# Patient Record
Sex: Female | Born: 1955 | Race: White | Hispanic: No | Smoking: Never smoker
Health system: Southern US, Community
[De-identification: ages and names within clinical notes are randomized; demographics above are authoritative.]

## PROBLEM LIST (undated history)

## (undated) DIAGNOSIS — M810 Age-related osteoporosis without current pathological fracture: Secondary | ICD-10-CM

---

## 2019-12-28 ENCOUNTER — Emergency Department (HOSPITAL_BASED_OUTPATIENT_CLINIC_OR_DEPARTMENT_OTHER)
Admission: EM | Admit: 2019-12-28 | Discharge: 2019-12-28 | Disposition: A | Discharge status: Home / Self Care | Payer: Medicare Other | Attending: Emergency Medicine | Admitting: Emergency Medicine

## 2019-12-28 ENCOUNTER — Emergency Department (HOSPITAL_BASED_OUTPATIENT_CLINIC_OR_DEPARTMENT_OTHER): Payer: Medicare Other

## 2019-12-28 ENCOUNTER — Encounter (HOSPITAL_BASED_OUTPATIENT_CLINIC_OR_DEPARTMENT_OTHER): Payer: Self-pay

## 2019-12-28 ENCOUNTER — Other Ambulatory Visit: Payer: Self-pay

## 2019-12-28 DIAGNOSIS — R519 Headache, unspecified: Secondary | ICD-10-CM | POA: Diagnosis not present

## 2019-12-28 DIAGNOSIS — R05 Cough: Secondary | ICD-10-CM | POA: Diagnosis not present

## 2019-12-28 DIAGNOSIS — R0602 Shortness of breath: Secondary | ICD-10-CM | POA: Diagnosis not present

## 2019-12-28 DIAGNOSIS — R5383 Other fatigue: Secondary | ICD-10-CM | POA: Diagnosis present

## 2019-12-28 DIAGNOSIS — R438 Other disturbances of smell and taste: Secondary | ICD-10-CM | POA: Insufficient documentation

## 2019-12-28 DIAGNOSIS — U071 COVID-19: Secondary | ICD-10-CM | POA: Insufficient documentation

## 2019-12-28 DIAGNOSIS — R0789 Other chest pain: Secondary | ICD-10-CM | POA: Diagnosis not present

## 2019-12-28 DIAGNOSIS — R531 Weakness: Secondary | ICD-10-CM | POA: Insufficient documentation

## 2019-12-28 DIAGNOSIS — M7918 Myalgia, other site: Secondary | ICD-10-CM | POA: Insufficient documentation

## 2019-12-28 HISTORY — DX: Age-related osteoporosis without current pathological fracture: M81.0

## 2019-12-28 LAB — COMPREHENSIVE METABOLIC PANEL
ALT: 122 U/L — ABNORMAL HIGH (ref 0–44)
AST: 113 U/L — ABNORMAL HIGH (ref 15–41)
Albumin: 3.2 g/dL — ABNORMAL LOW (ref 3.5–5.0)
Alkaline Phosphatase: 76 U/L (ref 38–126)
Anion gap: 9 (ref 5–15)
BUN: 13 mg/dL (ref 8–23)
CO2: 20 mmol/L — ABNORMAL LOW (ref 22–32)
Calcium: 8.4 mg/dL — ABNORMAL LOW (ref 8.9–10.3)
Chloride: 105 mmol/L (ref 98–111)
Creatinine, Ser: 0.65 mg/dL (ref 0.44–1.00)
GFR calc Af Amer: >60 mL/min (ref >60)
GFR calc non Af Amer: >60 mL/min (ref >60)
Glucose, Bld: 101 mg/dL — ABNORMAL HIGH (ref 70–99)
Potassium: 3.9 mmol/L (ref 3.5–5.1)
Sodium: 134 mmol/L — ABNORMAL LOW (ref 135–145)
Total Bilirubin: 0.6 mg/dL (ref 0.3–1.2)
Total Protein: 7 g/dL (ref 6.5–8.1)

## 2019-12-28 LAB — CBC WITH DIFFERENTIAL/PLATELET
Abs Immature Granulocytes: 0.01 10*3/uL (ref 0.00–0.07)
Basophils Absolute: 0 10*3/uL (ref 0.0–0.1)
Basophils Relative: 0 %
Eosinophils Absolute: 0 10*3/uL (ref 0.0–0.5)
Eosinophils Relative: 0 %
HCT: 41.5 % (ref 36.0–46.0)
Hemoglobin: 14.2 g/dL (ref 12.0–15.0)
Immature Granulocytes: 0 %
Lymphocytes Relative: 59 %
Lymphs Abs: 3.2 10*3/uL (ref 0.7–4.0)
MCH: 30 pg (ref 26.0–34.0)
MCHC: 34.2 g/dL (ref 30.0–36.0)
MCV: 87.7 fL (ref 80.0–100.0)
Monocytes Absolute: 0.4 10*3/uL (ref 0.1–1.0)
Monocytes Relative: 7 %
Neutro Abs: 1.8 10*3/uL (ref 1.7–7.7)
Neutrophils Relative %: 34 %
Platelets: 274 10*3/uL (ref 150–400)
RBC: 4.73 MIL/uL (ref 3.87–5.11)
RDW: 12.6 % (ref 11.5–15.5)
WBC: 5.4 10*3/uL (ref 4.0–10.5)
nRBC: 0 % (ref 0.0–0.2)

## 2019-12-28 MED ORDER — BENZONATATE 100 MG PO CAPS: 100.0000 mg | ORAL_CAPSULE | Freq: Three times a day (TID) | ORAL | 0 refills | Status: DC

## 2019-12-28 MED ORDER — BENZONATATE 100 MG PO CAPS: 100.0000 mg | ORAL_CAPSULE | Freq: Three times a day (TID) | ORAL | 0 refills | Status: AC

## 2019-12-28 NOTE — ED Triage Notes (Signed)
Pt was found to be COvid + 11 days ago states that she is not getting any better, c/o fatigue, loss of taste and smell, and cough.

## 2019-12-28 NOTE — ED Notes (Signed)
O2 sat 93-96% while ambulating

## 2019-12-28 NOTE — Discharge Instructions (Addendum)
Please read the attachment on ways to manage your COVID-19 symptoms.  Please continue take Tylenol as needed for any fever control.  I prescribed you Tessalon Perles to take as an anticough agent.  Please continue to hydrate and eat regular meals despite your lack of appetite and taste.  Return to the ED or seek immediate medical attention should you experience any new or worsening symptoms.

## 2019-12-28 NOTE — ED Provider Notes (Signed)
[facility] EMERGENCY DEPARTMENT Provider Note   CSN: [id number] Arrival date & time: 12/28/19  1534     History Chief Complaint  Patient presents with  . Fatigue    Linda Singleton is a 64 y.o. female with no PMH presents to the ED with complaints of ongoing fatigue, weakness, dry cough, body aches, diminished appetite, loss of taste and smell, and shortness of breath symptoms since diagnosis of COVID-19 on 12/17/2019.  Patient reports that she was symptomatic with a headache 1 day prior to being tested.  She states that she has been unable to care for self at home and has been relying on her friends and family to cook for, however they are now also sick with COVID-19.  She reports that her fevers are well controlled, but she continues to endorse weakness and fatigue which prompted her to come to the ED for evaluation.  She states that she is able to eat and drink and denies any nausea or vomiting, but states that she has been eating very little and had several episodes of loose stools earlier into her course of illness.  She also endorses mild chest discomfort, but purely associated with her cough and not with exertion.  She denies any dizziness, exertional chest pain, abdominal pain, nausea or vomiting, urinary symptoms, changes in bowel habits, numbness, unilateral weakness, blurred vision, or other neurologic symptoms.  HPI     Past Medical History:  Diagnosis Date  . Osteoporosis     There are no problems to display for this patient.   History reviewed. No pertinent surgical history.   OB History   No obstetric history on file.     No family history on file.  Social History   Tobacco Use  . Smoking status: Never Smoker  . Smokeless tobacco: Never Used  Substance Use Topics  . Alcohol use: Yes    Comment: social  . Drug use: Never    Home Medications Prior to Admission medications   Medication Sig Start Date End Date Taking? Authorizing Provider    benzonatate (TESSALON) 100 MG capsule Take 1 capsule (100 mg total) by mouth every 8 (eight) hours. 12/28/19   Corena Herter, PA-C    Allergies    Patient has no known allergies.  Review of Systems   Review of Systems  All other systems reviewed and are negative.   Physical Exam Updated Vital Signs BP (!) 120/106   Pulse 75   Temp 98.6 F (37 C) (Oral)   Resp 18   Ht 5\' 6"  (1.676 m)   Wt 65.8 kg   SpO2 96%   BMI 23.40 kg/m   Physical Exam Vitals and nursing note reviewed. Exam conducted with a chaperone present.  Constitutional:      Appearance: Normal appearance.  HENT:     Head: Normocephalic and atraumatic.     Mouth/Throat:     Pharynx: Oropharynx is clear.  Eyes:     General: No scleral icterus.    Conjunctiva/sclera: Conjunctivae normal.  Cardiovascular:     Rate and Rhythm: Normal rate and regular rhythm.     Pulses: Normal pulses.     Heart sounds: Normal heart sounds.  Pulmonary:     Comments: No significant increased work of breathing.  No tachypnea.  No accessory muscle use.  Breath sounds intact bilaterally.  No wheezing or significant rales appreciated.  Mild reproducible anterior chest wall tenderness to palpation. Abdominal:     General: Abdomen is flat.  There is no distension.     Palpations: Abdomen is soft.     Tenderness: There is no abdominal tenderness. There is no guarding.  Musculoskeletal:     Cervical back: Normal range of motion and neck supple. No rigidity.  Skin:    General: Skin is dry.     Capillary Refill: Capillary refill takes less than 2 seconds.  Neurological:     Mental Status: She is alert and oriented to person, place, and time.     GCS: GCS eye subscore is 4. GCS verbal subscore is 5. GCS motor subscore is 6.  Psychiatric:        Mood and Affect: Mood normal.        Behavior: Behavior normal.        Thought Content: Thought content normal.     ED Results / Procedures / Treatments   Labs (all labs ordered are  listed, but only abnormal results are displayed) Labs Reviewed  COMPREHENSIVE METABOLIC PANEL - Abnormal; Notable for the following components:      Result Value   Sodium 134 (*)    CO2 20 (*)    Glucose, Bld 101 (*)    Calcium 8.4 (*)    Albumin 3.2 (*)    AST 113 (*)    ALT 122 (*)    All other components within normal limits  CBC WITH DIFFERENTIAL/PLATELET    EKG None  Radiology DG Chest Portable 1 View  Result Date: 12/28/2019 CLINICAL DATA:  Fever, body aches and cough. EXAM: PORTABLE CHEST 1 VIEW COMPARISON:  None. FINDINGS: Mild linear scarring and/or atelectasis is seen within the retrocardiac region of the left lung base. There is no evidence of a pleural effusion or pneumothorax. The heart size and mediastinal contours are within normal limits. The visualized skeletal structures are unremarkable. IMPRESSION: Mild linear scarring and/or atelectasis within the left lung base. Electronically Signed   By: Aram Candela M.D.   On: 12/28/2019 16:23    Procedures Procedures (including critical care time)  Medications Ordered in ED Medications - No data to display  ED Course  I have reviewed the triage vital signs and the nursing notes.  Pertinent labs & imaging results that were available during my care of the patient were reviewed by me and considered in my medical decision making (see chart for details).    MDM Rules/Calculators/A&P                      I reviewed plain films obtained of chest which demonstrates mild linear scarring versus atelectasis in left lung base, however no pleural effusion, pneumothorax, or significant consolidation otherwise concerning for pneumonia.  Patient's CBC was entirely within normal limits, but CMP demonstrated transaminitis.  No recent labs with which to compare.  Repeat physical exam demonstrated no abdominal TTP or obvious hepatomegaly.  Will encourage patient to establish care with a primary care provider for ongoing evaluation  and management of her elevated liver enzymes.  There have been cases of transaminitis associated with COVID-19 infection, so this may be related to her viral illness.  Otherwise, no significant electrolyte derangement that needs correction here in the ED or hospital setting.    Patient was ambulated on pulse oximetry and patient maintained oxygen saturation of 93 to 96% I discussed today's findings with patient and she is reassured.  She will plan to get established with a primary care provider for discussion of her transaminitis should it fail to improve  after her COVID-19 has resolved.  Will prescribe Tessalon Perles given her ongoing cough despite Mucinex.  Strict ED return precautions discussed.  Patient voices understanding is agreeable to the plan.  Brihany Butch was evaluated in Emergency Department on 12/28/2019 for the symptoms described in the history of present illness. She was evaluated in the context of the global COVID-19 pandemic, which necessitated consideration that the patient might be at risk for infection with the SARS-CoV-2 virus that causes COVID-19. Institutional protocols and algorithms that pertain to the evaluation of patients at risk for COVID-19 are in a state of rapid change based on information released by regulatory bodies including the CDC and federal and state organizations. These policies and algorithms were followed during the patient's care in the ED.   Final Clinical Impression(s) / ED Diagnoses Final diagnoses:  COVID-19    Rx / DC Orders ED Discharge Orders         Ordered    benzonatate (TESSALON) 100 MG capsule  Every 8 hours     12/28/19 1814           Lorelee New, PA-C 12/28/19 1818    Cathren Laine, MD 12/28/19 2303

## 2021-03-19 IMAGING — DX DG CHEST 1V PORT
1 series · 1 of 1 positions shown · non-contrast
Comparison: None.

CLINICAL DATA: Fever, body aches and cough.

EXAM:
PORTABLE CHEST 1 VIEW

[chest ap]
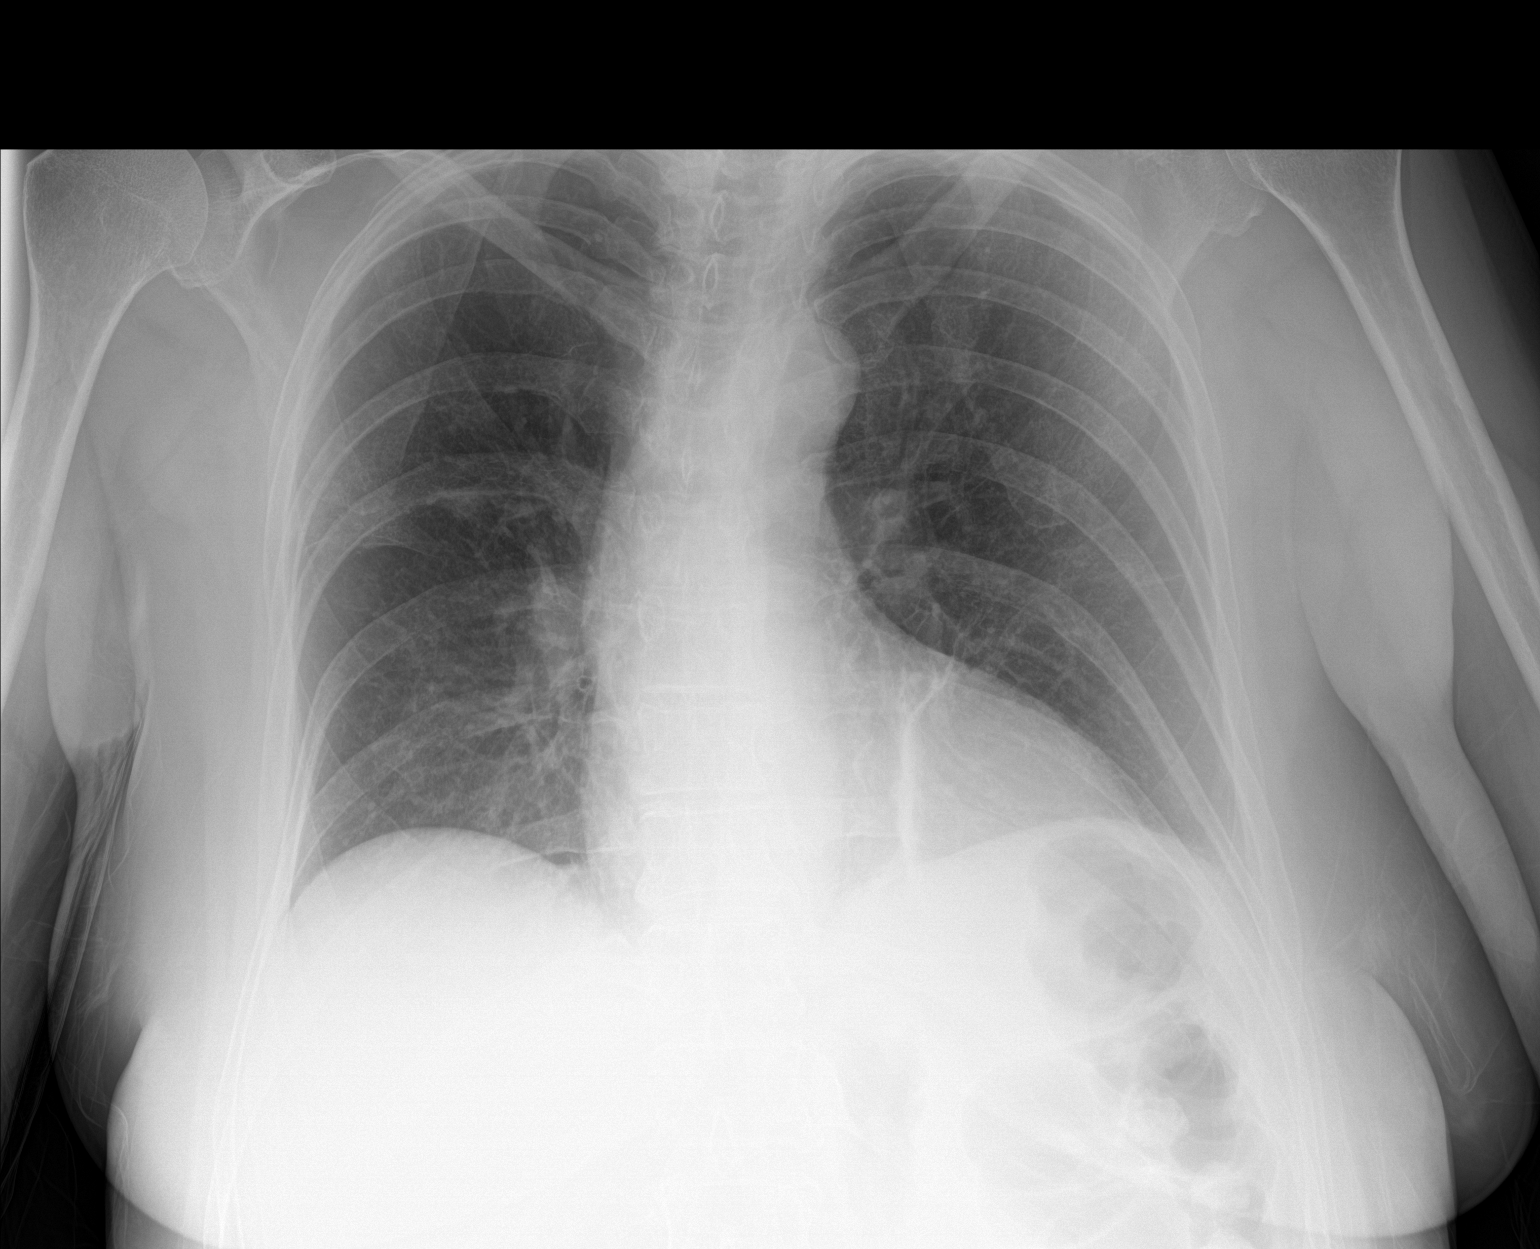

[1 of 1 positions shown; findings below may reference images not displayed]

FINDINGS: Mild linear scarring and/or atelectasis is seen within the
retrocardiac region of the left lung base. There is no evidence of a
pleural effusion or pneumothorax. The heart size and mediastinal
contours are within normal limits. The visualized skeletal
structures are unremarkable.
IMPRESSION: Mild linear scarring and/or atelectasis within the left lung base.
